# Patient Record
Sex: Female | Born: 1960 | Race: White | Hispanic: No | Marital: Married | Smoking: Never smoker
Health system: Southern US, Community
[De-identification: ages and names within clinical notes are randomized; demographics above are authoritative.]

## PROBLEM LIST (undated history)

## (undated) DIAGNOSIS — E119 Type 2 diabetes mellitus without complications: Secondary | ICD-10-CM

## (undated) DIAGNOSIS — E785 Hyperlipidemia, unspecified: Secondary | ICD-10-CM

## (undated) DIAGNOSIS — I10 Essential (primary) hypertension: Secondary | ICD-10-CM

## (undated) DIAGNOSIS — F411 Generalized anxiety disorder: Secondary | ICD-10-CM

## (undated) DIAGNOSIS — G459 Transient cerebral ischemic attack, unspecified: Secondary | ICD-10-CM

## (undated) HISTORY — DX: Generalized anxiety disorder: F41.1

## (undated) HISTORY — DX: Transient cerebral ischemic attack, unspecified: G45.9

## (undated) HISTORY — DX: Type 2 diabetes mellitus without complications: E11.9

## (undated) HISTORY — DX: Hyperlipidemia, unspecified: E78.5

## (undated) HISTORY — PX: DILATION AND CURETTAGE OF UTERUS: SHX78

## (undated) HISTORY — DX: Essential (primary) hypertension: I10

---

## 2011-04-23 ENCOUNTER — Other Ambulatory Visit (HOSPITAL_COMMUNITY): Payer: Self-pay | Admitting: Family Medicine

## 2011-04-23 DIAGNOSIS — Z139 Encounter for screening, unspecified: Secondary | ICD-10-CM

## 2011-04-24 ENCOUNTER — Telehealth: Payer: Self-pay

## 2011-04-24 DIAGNOSIS — Z800 Family history of malignant neoplasm of digestive organs: Secondary | ICD-10-CM

## 2011-04-24 DIAGNOSIS — Z139 Encounter for screening, unspecified: Secondary | ICD-10-CM

## 2011-04-24 NOTE — Telephone Encounter (Signed)
Gastroenterology Pre-Procedure Form  Request Date: 04/23/2011    Requesting Physician: Rush Barer, PA-C [facility] )     PATIENT INFORMATION:  Erin Olson is a 50 y.o., female (DOB=05-May-1961).  PROCEDURE: Procedure(s) requested: colonoscopy Procedure Reason: family history of colon cancer  PATIENT REVIEW QUESTIONS: The patient reports the following:   1. Diabetes Melitis: Yes 2. Joint replacements in the past 12 months: no 3. Major health problems in the past 3 months: no 4. Has an artificial valve or MVP:no 5. Has been advised in past to take antibiotics in advance of a procedure like teeth cleaning: no}    MEDICATIONS & ALLERGIES:    Patient reports the following regarding taking any blood thinners:   Plavix? no Aspirin? Yes Coumadin?  no  Patient confirms/reports the following medications:  Current Outpatient Prescriptions  Medication Sig Dispense Refill  . fish oil-omega-3 fatty acids 1000 MG capsule 1,200 mg 2 (two) times daily.        . insulin glargine (LANTUS) 100 UNIT/ML injection Inject 26 Units into the skin at bedtime.        Marland Kitchen lisinopril (PRINIVIL,ZESTRIL) 2.5 MG tablet Take 2.5 mg by mouth daily.        . Multiple Vitamin (MULTIVITAMIN) tablet Take 1 tablet by mouth daily.        . sertraline (ZOLOFT) 100 MG tablet Take 100 mg by mouth daily.          Patient confirms/reports the following allergies:  No Known Allergies  Patient is appropriate to schedule for requested procedure(s): yes  AUTHORIZATION INFORMATION Primary Insurance:   ID #: Group #:  Pre-Cert / Auth required:  Pre-Cert / Auth #:   PT IS CONTACTING BETTY RATLIFF ABOUT [place] DISCOUNT  Orders Placed This Encounter  Procedures  . Endoscopy, colon, diagnostic    Standing Status: Future     Number of Occurrences:      Standing Expiration Date: 04/23/2012    Order Specific Question:  Pre-op diagnosis    Answer:  No    Order Specific Question:  Pre-op visit  required?    Answer:  No [0]    SCHEDULE INFORMATION: Procedure has been scheduled as follows:  Date: 05/14/2011   Time:7:30 am Location: [facility] Short Stay  This Gastroenterology Pre-Precedure Form is being routed to the following provider(s) for review: Lorenza Burton, NP

## 2011-04-25 ENCOUNTER — Ambulatory Visit (HOSPITAL_COMMUNITY): Payer: Self-pay

## 2011-04-25 NOTE — Telephone Encounter (Signed)
Take 1/2 insulin night before procedure Check blood sugars frequently Note specific family hx on chart please

## 2011-04-30 NOTE — Telephone Encounter (Signed)
Pts Mom had colon cancer at age 52. Is doing well now. Pt was informed to check blood sugars regularly while doing prep and to take 1/2  Insulin night prior to procedure.

## 2011-05-01 ENCOUNTER — Ambulatory Visit (HOSPITAL_COMMUNITY)
Admission: RE | Admit: 2011-05-01 | Discharge: 2011-05-01 | Disposition: A | Discharge status: Home / Self Care | Payer: Self-pay | Source: Ambulatory Visit | Attending: Family Medicine | Admitting: Family Medicine

## 2011-05-01 DIAGNOSIS — Z139 Encounter for screening, unspecified: Secondary | ICD-10-CM

## 2011-05-01 DIAGNOSIS — Z1231 Encounter for screening mammogram for malignant neoplasm of breast: Secondary | ICD-10-CM | POA: Insufficient documentation

## 2011-05-14 ENCOUNTER — Encounter: Payer: Self-pay | Admitting: Internal Medicine

## 2011-05-14 ENCOUNTER — Ambulatory Visit (HOSPITAL_COMMUNITY): Admission: RE | Admit: 2011-05-14 | Payer: Self-pay | Source: Ambulatory Visit | Admitting: Internal Medicine

## 2011-07-09 ENCOUNTER — Encounter: Payer: Self-pay | Admitting: Internal Medicine

## 2011-07-09 ENCOUNTER — Encounter (HOSPITAL_COMMUNITY): Admission: RE | Payer: Self-pay | Source: Ambulatory Visit

## 2011-07-09 ENCOUNTER — Ambulatory Visit (HOSPITAL_COMMUNITY): Admission: RE | Admit: 2011-07-09 | Payer: Self-pay | Source: Ambulatory Visit | Admitting: Internal Medicine

## 2011-07-09 SURGERY — COLONOSCOPY
Anesthesia: Moderate Sedation

## 2017-06-17 ENCOUNTER — Inpatient Hospital Stay
Admission: RE | Admit: 2017-06-17 | Discharge: 2017-06-17 | Disposition: A | Discharge status: Home / Self Care | Payer: Self-pay | Source: Ambulatory Visit | Attending: *Deleted | Admitting: *Deleted

## 2017-06-17 ENCOUNTER — Ambulatory Visit
Admission: RE | Admit: 2017-06-17 | Discharge: 2017-06-17 | Disposition: A | Discharge status: Home / Self Care | Payer: Self-pay | Source: Ambulatory Visit | Attending: Oncology | Admitting: Oncology

## 2017-06-17 ENCOUNTER — Ambulatory Visit: Payer: Self-pay | Attending: Oncology

## 2017-06-17 ENCOUNTER — Other Ambulatory Visit: Payer: Self-pay | Admitting: *Deleted

## 2017-06-17 VITALS — BP 150/84 | HR 74 | Temp 98.5°F | Resp 16 | Ht 64.0 in | Wt 214.0 lb

## 2017-06-17 DIAGNOSIS — N63 Unspecified lump in unspecified breast: Secondary | ICD-10-CM

## 2017-06-17 DIAGNOSIS — Z9289 Personal history of other medical treatment: Secondary | ICD-10-CM

## 2017-06-17 NOTE — Progress Notes (Signed)
Subjective:     Patient ID: Erin SpittleJohonnah Olson, female   DOB: [birth year], 56 y.o.   MRN: [id number]  HPI   Review of Systems     Objective:   Physical Exam  Pulmonary/Chest: Right breast exhibits no inverted nipple, no mass, no nipple discharge, no skin change and no tenderness. Left breast exhibits no inverted nipple, no mass, no nipple discharge, no skin change and no tenderness. Breasts are symmetrical.    Bilateral thickening upper outer quadrants       Assessment:     56 [facility]  [facility] follow-up mamm[facility] for left breast mass per patient.  Original mammogram performed on [facility] unit.  Patient screened, and meets BCCCP eligibility.  Patient does not have insurance, Medicare or Medicaid.  Handout given on Affordable Care Act.  Instructed patient on breast self-exam using teach back method.  Palpated bilateral thickening upper outer quadrant.      Plan:     Sent for bilateral diagnostic mammogram, and bilateral breast ultrasound .  Patient is to return for pap due to need [facility].

## 2017-07-15 ENCOUNTER — Ambulatory Visit: Payer: Self-pay | Attending: Oncology

## 2017-07-15 ENCOUNTER — Ambulatory Visit: Admission: RE | Admit: 2017-07-15 | Payer: Self-pay | Source: Ambulatory Visit

## 2017-07-15 ENCOUNTER — Ambulatory Visit
Admission: RE | Admit: 2017-07-15 | Discharge: 2017-07-15 | Disposition: A | Discharge status: Home / Self Care | Payer: Self-pay | Source: Ambulatory Visit | Attending: Oncology | Admitting: Oncology

## 2017-07-15 VITALS — BP 136/78 | HR 90 | Temp 98.1°F | Resp 18 | Ht 63.0 in | Wt 215.2 lb

## 2017-07-15 DIAGNOSIS — Z0000 Encounter for general adult medical examination without abnormal findings: Secondary | ICD-10-CM

## 2017-07-15 DIAGNOSIS — N63 Unspecified lump in unspecified breast: Secondary | ICD-10-CM

## 2017-07-15 NOTE — Progress Notes (Signed)
Subjective:     Patient ID: Erin Olson, female   DOB: 14-Sep-1961, 56 y.o.   MRN: [id number]  HPI   Review of Systems     Objective:   Physical Exam  Genitourinary: No labial fusion. There is no rash, tenderness, lesion or injury on the right labia. There is no rash, tenderness, lesion or injury on the left labia. Uterus is not deviated, not enlarged, not fixed and not tender. Cervix exhibits no motion tenderness, no discharge and no friability. Right adnexum displays no mass, no tenderness and no fullness. Left adnexum displays no mass, no tenderness and no fullness. No erythema, tenderness or bleeding in the vagina. No foreign body in the vagina. No signs of injury around the vagina. No vaginal discharge found.       Assessment:     56 year old patient returns for pap. Pelvic exam normal.    Plan:     Specimen collected for pap.   Patient had additional views today.  Results Birads 3 given to patient.  Erin Olson to schedule patient for 6 month follow-up mammogram and ultrasound.

## 2017-07-18 LAB — PAP LB AND HPV HIGH-RISK
HPV, high-risk: NEGATIVE
PAP Smear Comment: 0

## 2017-10-21 NOTE — Progress Notes (Signed)
Letter mailed to notify patient of normal pap results, and to remind patient of 6 mont follow-up mammogram.  Copy to [facility]IS.

## 2018-01-19 ENCOUNTER — Ambulatory Visit
Admission: RE | Admit: 2018-01-19 | Discharge: 2018-01-19 | Disposition: A | Discharge status: Home / Self Care | Payer: Self-pay | Source: Ambulatory Visit | Attending: Oncology | Admitting: Oncology

## 2018-01-19 DIAGNOSIS — N63 Unspecified lump in unspecified breast: Secondary | ICD-10-CM

## 2018-01-19 DIAGNOSIS — R928 Other abnormal and inconclusive findings on diagnostic imaging of breast: Secondary | ICD-10-CM | POA: Insufficient documentation

## 2018-01-19 DIAGNOSIS — N6321 Unspecified lump in the left breast, upper outer quadrant: Secondary | ICD-10-CM | POA: Insufficient documentation

## 2018-01-20 ENCOUNTER — Encounter: Payer: Self-pay | Admitting: *Deleted

## 2018-01-20 NOTE — Progress Notes (Signed)
Patient with birads 2 results from 6 month follow up mammogram.  Next bilateral mammogram due in July.  Letter mailed to inform patient of her next [facility]CP and mammogram appointment on July 21, 2018 @ 1:00.  [facility] [place].

## 2018-07-21 ENCOUNTER — Ambulatory Visit: Payer: Self-pay | Attending: Oncology

## 2020-04-20 ENCOUNTER — Other Ambulatory Visit: Payer: Self-pay | Admitting: Emergency Medicine

## 2020-04-20 DIAGNOSIS — Z1231 Encounter for screening mammogram for malignant neoplasm of breast: Secondary | ICD-10-CM

## 2020-04-25 ENCOUNTER — Encounter: Payer: Self-pay | Admitting: Nurse Practitioner

## 2020-05-10 ENCOUNTER — Telehealth: Payer: Self-pay

## 2020-05-10 NOTE — Telephone Encounter (Signed)
Pt called and works for [employer]. Her insurance is [employer] and it had [facility] listed on the card and doesn't have EG listed. Pt wants to make sure all providers accept her insurance prior to coming to her apt. [phone] ext [phone]

## 2020-05-11 NOTE — Telephone Encounter (Signed)
She will have to call, I will let her know..I would think it would be fine

## 2020-05-11 NOTE — Telephone Encounter (Signed)
Noted  

## 2020-05-29 ENCOUNTER — Encounter: Payer: Self-pay | Admitting: Nurse Practitioner

## 2020-05-29 ENCOUNTER — Ambulatory Visit: Payer: Self-pay | Admitting: Nurse Practitioner

## 2020-05-29 NOTE — Progress Notes (Deleted)
Primary Care Physician:  Ivan Anchors, FNP Primary Gastroenterologist:  Dr. Gala Romney  No chief complaint on file.   HPI:   Erin Olson is a 59 y.o. female who presents on referral from primary care to schedule colonoscopy.  Nurse/phone triage was deferred office visit due to medications likely necessitating MAC sedation.  It appears she was previously triaged in 2012, though I cannot find a record of colonoscopy being completed at that time.  Reviewed information provided with referral including ***.  Today she states   No past medical history on file.  No past surgical history on file.  Current Outpatient Medications  Medication Sig Dispense Refill  . fish oil-omega-3 fatty acids 1000 MG capsule 1,200 mg 2 (two) times daily.      . insulin glargine (LANTUS) 100 UNIT/ML injection Inject 26 Units into the skin at bedtime.      Marland Kitchen lisinopril (PRINIVIL,ZESTRIL) 2.5 MG tablet Take 2.5 mg by mouth daily.      . Multiple Vitamin (MULTIVITAMIN) tablet Take 1 tablet by mouth daily.      . sertraline (ZOLOFT) 100 MG tablet Take 100 mg by mouth daily.       No current facility-administered medications for this visit.    Allergies as of 05/29/2020 - Review Complete 07/15/2017  Allergen Reaction Noted  . Latex  06/17/2017    Family History  Problem Relation Age of Onset  . Breast cancer Neg Hx     Social History   Socioeconomic History  . Marital status: Married    Spouse name: Not on file  . Number of children: Not on file  . Years of education: Not on file  . Highest education level: Not on file  Occupational History  . Not on file  Tobacco Use  . Smoking status: Never Smoker  . Smokeless tobacco: Never Used  Substance and Sexual Activity  . Alcohol use: No  . Drug use: No  . Sexual activity: Yes  Other Topics Concern  . Not on file  Social History Narrative  . Not on file   Social Determinants of Health   Financial Resource Strain:   . Difficulty of Paying  Living Expenses:   Food Insecurity:   . Worried About Charity fundraiser in the Last Year:   . Arboriculturist in the Last Year:   Transportation Needs:   . Film/video editor (Medical):   Marland Kitchen Lack of Transportation (Non-Medical):   Physical Activity:   . Days of Exercise per Week:   . Minutes of Exercise per Session:   Stress:   . Feeling of Stress :   Social Connections:   . Frequency of Communication with Friends and Family:   . Frequency of Social Gatherings with Friends and Family:   . Attends Religious Services:   . Active Member of Clubs or Organizations:   . Attends Archivist Meetings:   Marland Kitchen Marital Status:   Intimate Partner Violence:   . Fear of Current or Ex-Partner:   . Emotionally Abused:   Marland Kitchen Physically Abused:   . Sexually Abused:     Subjective: Review of Systems  Constitutional: Negative for chills, fever, malaise/fatigue and weight loss.  HENT: Negative for congestion and sore throat.   Respiratory: Negative for cough and shortness of breath.   Cardiovascular: Negative for chest pain and palpitations.  Gastrointestinal: Negative for abdominal pain, blood in stool, diarrhea, melena, nausea and vomiting.  Musculoskeletal: Negative for joint pain and  myalgias.  Skin: Negative for rash.  Neurological: Negative for dizziness and weakness.  Endo/Heme/Allergies: Does not bruise/bleed easily.  Psychiatric/Behavioral: Negative for depression. The patient is not nervous/anxious.   All other systems reviewed and are negative.      Objective: There were no vitals taken for this visit. Physical Exam Vitals and nursing note reviewed.  Constitutional:      General: She is not in acute distress.    Appearance: Normal appearance. She is well-developed. She is not ill-appearing, toxic-appearing or diaphoretic.  HENT:     Head: Normocephalic and atraumatic.     Nose: No congestion or rhinorrhea.  Eyes:     General: No scleral icterus. Cardiovascular:       Rate and Rhythm: Normal rate and regular rhythm.     Heart sounds: Normal heart sounds.  Pulmonary:     Effort: Pulmonary effort is normal. No respiratory distress.     Breath sounds: Normal breath sounds.  Abdominal:     General: Bowel sounds are normal.     Palpations: Abdomen is soft. There is no hepatomegaly, splenomegaly or mass.     Tenderness: There is no abdominal tenderness. There is no guarding or rebound.     Hernia: No hernia is present.  Skin:    General: Skin is warm and dry.     Coloration: Skin is not jaundiced.     Findings: No rash.  Neurological:     General: No focal deficit present.     Mental Status: She is alert and oriented to person, place, and time.  Psychiatric:        Attention and Perception: Attention normal.        Mood and Affect: Mood normal.        Speech: Speech normal.        Behavior: Behavior normal.        Thought Content: Thought content normal.        Cognition and Memory: Cognition and memory normal.       05/29/2020 9:57 AM   Disclaimer: This note was dictated with voice recognition software. Similar sounding words can inadvertently be transcribed and may not be corrected upon review.

## 2020-08-13 ENCOUNTER — Other Ambulatory Visit (HOSPITAL_COMMUNITY): Payer: Self-pay | Admitting: Emergency Medicine

## 2020-08-13 ENCOUNTER — Other Ambulatory Visit: Payer: Self-pay | Admitting: Emergency Medicine

## 2020-08-13 DIAGNOSIS — G459 Transient cerebral ischemic attack, unspecified: Secondary | ICD-10-CM

## 2020-12-03 ENCOUNTER — Other Ambulatory Visit (HOSPITAL_COMMUNITY): Payer: Self-pay | Admitting: Emergency Medicine

## 2020-12-03 DIAGNOSIS — G459 Transient cerebral ischemic attack, unspecified: Secondary | ICD-10-CM

## 2021-04-04 ENCOUNTER — Encounter: Payer: Self-pay | Admitting: Orthopaedic Surgery

## 2021-04-04 ENCOUNTER — Other Ambulatory Visit: Payer: Self-pay

## 2021-04-04 ENCOUNTER — Ambulatory Visit: Payer: BC Managed Care – PPO | Admitting: Orthopaedic Surgery

## 2021-04-04 ENCOUNTER — Ambulatory Visit: Payer: BC Managed Care – PPO

## 2021-04-04 VITALS — BP 124/71 | HR 87 | Ht 61.0 in | Wt 234.5 lb

## 2021-04-04 DIAGNOSIS — Z6841 Body Mass Index (BMI) 40.0 and over, adult: Secondary | ICD-10-CM

## 2021-04-04 DIAGNOSIS — M7062 Trochanteric bursitis, left hip: Secondary | ICD-10-CM | POA: Diagnosis not present

## 2021-04-04 DIAGNOSIS — M5432 Sciatica, left side: Secondary | ICD-10-CM

## 2021-04-04 MED ORDER — NAPROXEN 500 MG PO TABS: 500.0000 mg | ORAL_TABLET | Freq: Two times a day (BID) | ORAL | 5 refills | Status: DC

## 2021-04-04 MED ORDER — CYCLOBENZAPRINE HCL 10 MG PO TABS: 10.0000 mg | ORAL_TABLET | Freq: Every day | ORAL | 0 refills | Status: DC

## 2021-04-04 MED ORDER — HYDROCODONE-ACETAMINOPHEN 5-325 MG PO TABS: ORAL_TABLET | ORAL | 0 refills | Status: DC

## 2021-04-04 NOTE — Patient Instructions (Signed)
Out of work today and tomorrow. 

## 2021-04-04 NOTE — Progress Notes (Signed)
Subjective:    Patient ID: Erin Olson, female    DOB: [birth year], 60 y.o.   MRN: [id number]  HPI She has been having left hip pain and lower back pain for about six weeks or so.  It has been getting worse.  She is not sleeping well. She is having pain sitting for a while.  The pain is on the outside of the left hip and radiates to the knee but not below.  She has been seen at [facility].  I have copies of their notes. She was first seen there for this on 03-07-21.  She had X-rays of the hip and pelvis.  I have independently reviewed and interpreted x-rays of this patient done at another site by another physician or qualified health professional.  I have reviewed the notes.  She is not improving. She has taken two prednisone dose packs, has been on ibuprofen 800 tid, and on Toradol. She says she is hurting more.  She has no trauma.    She works as a Scientist, physiological.   Review of Systems  Constitutional: Positive for activity change.  Musculoskeletal: Positive for arthralgias, gait problem and myalgias.  All other systems reviewed and are negative.  For Review of Systems, all other systems reviewed and are negative.  The following is a summary of the past history medically, past history surgically, known current medicines, social history and family history.  This information is gathered electronically by the computer from prior information and documentation.  I review this each visit and have found including this information at this point in the chart is beneficial and informative.   History reviewed. No pertinent past medical history.  History reviewed. No pertinent surgical history.  Current Outpatient Medications on File Prior to Visit  Medication Sig Dispense Refill  . ALPRAZolam (XANAX) 0.5 MG tablet Take 0.5 mg by mouth at bedtime as needed for anxiety.    . diclofenac Sodium (VOLTAREN) 1 % GEL Apply topically 4 (four) times daily.    Marland Kitchen gabapentin (NEURONTIN) 300 MG capsule Take  300 mg by mouth 3 (three) times daily.    . Multiple Vitamin (MULTIVITAMIN) tablet Take 1 tablet by mouth daily.    . sertraline (ZOLOFT) 100 MG tablet Take 100 mg by mouth daily.    . traMADol (ULTRAM) 50 MG tablet Take by mouth every 6 (six) hours as needed.    . fish oil-omega-3 fatty acids 1000 MG capsule 1,200 mg 2 (two) times daily.   (Patient not taking: Reported on 04/04/2021)    . insulin glargine (LANTUS) 100 UNIT/ML injection Inject 26 Units into the skin at bedtime.   (Patient not taking: Reported on 04/04/2021)    . lisinopril (PRINIVIL,ZESTRIL) 2.5 MG tablet Take 2.5 mg by mouth daily.   (Patient not taking: Reported on 04/04/2021)     No current facility-administered medications on file prior to visit.    Social History   Socioeconomic History  . Marital status: Married    Spouse name: Not on file  . Number of children: Not on file  . Years of education: Not on file  . Highest education level: Not on file  Occupational History  . Not on file  Tobacco Use  . Smoking status: Never Smoker  . Smokeless tobacco: Never Used  Vaping Use  . Vaping Use: Never used  Substance and Sexual Activity  . Alcohol use: No  . Drug use: No  . Sexual activity: Yes  Other Topics Concern  . Not on  file  Social History Narrative  . Not on file   Social Determinants of Health   Financial Resource Strain: Not on file  Food Insecurity: Not on file  Transportation Needs: Not on file  Physical Activity: Not on file  Stress: Not on file  Social Connections: Not on file  Intimate Partner Violence: Not on file    Family History  Problem Relation Age of Onset  . Breast cancer Neg Hx     BP 124/71   Pulse 87   Ht 5\' 1"  (1.549 m)   Wt 234 lb 8 oz (106.4 kg)   BMI 44.31 kg/m   Body mass index is 44.31 kg/m.      Objective:   Physical Exam Vitals and nursing note reviewed. Exam conducted with a chaperone present.  Constitutional:      Appearance: She is well-developed.  HENT:      Head: Normocephalic and atraumatic.  Eyes:     Conjunctiva/sclera: Conjunctivae normal.     Pupils: Pupils are equal, round, and reactive to light.  Cardiovascular:     Rate and Rhythm: Normal rate and regular rhythm.  Pulmonary:     Effort: Pulmonary effort is normal.  Abdominal:     Palpations: Abdomen is soft.  Musculoskeletal:     Cervical back: Normal range of motion and neck supple.       Legs:  Skin:    General: Skin is warm and dry.  Neurological:     Mental Status: She is alert and oriented to person, place, and time.     Cranial Nerves: No cranial nerve deficit.     Motor: No abnormal muscle tone.     Coordination: Coordination normal.     Deep Tendon Reflexes: Reflexes are normal and symmetric. Reflexes normal.  Psychiatric:        Behavior: Behavior normal.        Thought Content: Thought content normal.        Judgment: Judgment normal.    X-rays were done of the lumbar spine, reported separately.      Assessment & Plan:   Encounter Diagnoses  Name Primary?  . Back pain with left-sided sciatica Yes  . Trochanteric bursitis, left hip   . Body mass index 40.0-44.9, adult (HCC)   . Morbid obesity (HCC)    PROCEDURE NOTE:  The patient request injection, verbal consent was obtained.  The left trochanteric area of the hip was prepped appropriately after time out was performed.   Sterile technique was observed and injection of 1 cc of Celestone 6 mg with several cc's of plain xylocaine. Anesthesia was provided by ethyl chloride and a 20-gauge needle was used to inject the hip area. The injection was tolerated well.  A band aid dressing was applied.  The patient was advised to apply ice later today and tomorrow to the injection sight as needed.  I will change to naprosyn, flexeril and norco.  I have reviewed the 10-11-1982 Controlled Substance Reporting System web site prior to prescribing narcotic medicine for this patient.   She may need MRI  of the lumbar spine.  Return in two weeks.  Stay out of work today and tomorrow.  Call if any problem.  Precautions discussed.     Electronically Signed [place], MD 4/7/20229:59 AM

## 2021-04-18 ENCOUNTER — Ambulatory Visit: Payer: BC Managed Care – PPO | Admitting: Orthopaedic Surgery

## 2021-04-18 ENCOUNTER — Other Ambulatory Visit: Payer: Self-pay

## 2021-04-18 ENCOUNTER — Encounter: Payer: Self-pay | Admitting: Orthopaedic Surgery

## 2021-04-18 VITALS — Ht 61.0 in | Wt 230.0 lb

## 2021-04-18 DIAGNOSIS — M7062 Trochanteric bursitis, left hip: Secondary | ICD-10-CM | POA: Diagnosis not present

## 2021-04-18 DIAGNOSIS — Z6841 Body Mass Index (BMI) 40.0 and over, adult: Secondary | ICD-10-CM

## 2021-04-18 DIAGNOSIS — M5432 Sciatica, left side: Secondary | ICD-10-CM | POA: Diagnosis not present

## 2021-04-18 MED ORDER — CYCLOBENZAPRINE HCL 10 MG PO TABS: 10.0000 mg | ORAL_TABLET | Freq: Every day | ORAL | 0 refills | Status: DC

## 2021-04-18 MED ORDER — HYDROCODONE-ACETAMINOPHEN 5-325 MG PO TABS: ORAL_TABLET | ORAL | 0 refills | Status: DC

## 2021-04-18 NOTE — Progress Notes (Signed)
Patient DQ:[id number] Erin Olson, female DOB:1961-05-14, 60 y.o. [id number]  Chief Complaint  Patient presents with  . Back Pain    HPI  Erin Olson is a 60 y.o. female who has back pain and left sided trochanteric bursitis.  She is better. She still has pain but much less.  She is sleeping better.  She is taking her medicine. I have encouraged walking.  I will not get MRI as she is improving.   Body mass index is 43.46 kg/m.  The patient meets the AMA guidelines for Morbid (severe) obesity with a BMI > 40.0 and I have recommended weight loss.   ROS  Review of Systems  Constitutional: Positive for activity change.  Musculoskeletal: Positive for arthralgias, gait problem and myalgias.  All other systems reviewed and are negative.   All other systems reviewed and are negative.  The following is a summary of the past history medically, past history surgically, known current medicines, social history and family history.  This information is gathered electronically by the computer from prior information and documentation.  I review this each visit and have found including this information at this point in the chart is beneficial and informative.    History reviewed. No pertinent past medical history.  History reviewed. No pertinent surgical history.  Family History  Problem Relation Age of Onset  . Breast cancer Neg Hx     Social History Social History   Tobacco Use  . Smoking status: Never Smoker  . Smokeless tobacco: Never Used  Vaping Use  . Vaping Use: Never used  Substance Use Topics  . Alcohol use: No  . Drug use: No    Allergies  Allergen Reactions  . Latex     Current Outpatient Medications  Medication Sig Dispense Refill  . ALPRAZolam (XANAX) 0.5 MG tablet Take 0.5 mg by mouth at bedtime as needed for anxiety.    Marland Kitchen atorvastatin (LIPITOR) 40 MG tablet Take 1 tablet by mouth daily.    . cyclobenzaprine (FLEXERIL) 10 MG tablet Take 1 tablet (10 mg total) by  mouth at bedtime. One tablet every night at bedtime as needed for spasm. 30 tablet 0  . diclofenac Sodium (VOLTAREN) 1 % GEL Apply topically 4 (four) times daily.    . fish oil-omega-3 fatty acids 1000 MG capsule 1,200 mg 2 (two) times daily.    Marland Kitchen gabapentin (NEURONTIN) 300 MG capsule Take 300 mg by mouth 3 (three) times daily.    Marland Kitchen HYDROcodone-acetaminophen (NORCO/VICODIN) 5-325 MG tablet One tablet every four hours for pain. 30 tablet 0  . LEVEMIR FLEXTOUCH 100 UNIT/ML FlexPen Inject into the skin.    Marland Kitchen lisinopril (PRINIVIL,ZESTRIL) 2.5 MG tablet Take 2.5 mg by mouth daily.    . Multiple Vitamin (MULTIVITAMIN) tablet Take 1 tablet by mouth daily.    . naproxen (NAPROSYN) 500 MG tablet Take 1 tablet (500 mg total) by mouth 2 (two) times daily with a meal. 60 tablet 5  . NOVOLOG FLEXPEN 100 UNIT/ML FlexPen Inject into the skin.    Marland Kitchen sertraline (ZOLOFT) 100 MG tablet Take 100 mg by mouth daily.    . traMADol (ULTRAM) 50 MG tablet Take by mouth every 6 (six) hours as needed.     No current facility-administered medications for this visit.     Physical Exam  Height 5\' 1"  (1.549 m), weight 230 lb (104.3 kg).  Constitutional: overall normal hygiene, normal nutrition, well developed, normal grooming, normal body habitus. Assistive device:none  Musculoskeletal: gait and station Limp none,  muscle tone and strength are normal, no tremors or atrophy is present.  .  Neurological: coordination overall normal.  Deep tendon reflex/nerve stretch intact.  Sensation normal.  Cranial nerves II-XII intact.   Skin:   Normal overall no scars, lesions, ulcers or rashes. No psoriasis.  Psychiatric: Alert and oriented x 3.  Recent memory intact, remote memory unclear.  Normal mood and affect. Well groomed.  Good eye contact.  Cardiovascular: overall no swelling, no varicosities, no edema bilaterally, normal temperatures of the legs and arms, no clubbing, cyanosis and good capillary refill.  Lymphatic:  palpation is normal.  Spine/Pelvis examination:  Inspection:  Overall, sacoiliac joint benign and hips nontender; without crepitus or defects.   Thoracic spine inspection: Alignment normal without kyphosis present   Lumbar spine inspection:  Alignment  with normal lumbar lordosis, without scoliosis apparent.   Thoracic spine palpation:  without tenderness of spinal processes   Lumbar spine palpation: without tenderness of lumbar area; without tightness of lumbar muscles    Range of Motion:   Lumbar flexion, forward flexion is normal without pain or tenderness    Lumbar extension is full without pain or tenderness   Left lateral bend is normal without pain or tenderness   Right lateral bend is normal without pain or tenderness   Straight leg raising is normal  Strength & tone: normal   Stability overall normal stability Hip has full ROM left and much less pain laterally. All other systems reviewed and are negative   The patient has been educated about the nature of the problem(s) and counseled on treatment options.  The patient appeared to understand what I have discussed and is in agreement with it.  Encounter Diagnoses  Name Primary?  . Back pain with left-sided sciatica Yes  . Trochanteric bursitis, left hip   . Body mass index 40.0-44.9, adult (HCC)   . Morbid obesity (HCC)     PLAN Call if any problems.  Precautions discussed.  Continue current medications.   Return to clinic 3 weeks   I refilled her pain medicine and Flexeril.  Electronically Signed Darreld Mclean, MD 4/21/20228:56 AM

## 2021-05-09 ENCOUNTER — Encounter: Payer: Self-pay | Admitting: Orthopaedic Surgery

## 2021-05-09 ENCOUNTER — Other Ambulatory Visit: Payer: Self-pay

## 2021-05-09 ENCOUNTER — Ambulatory Visit: Payer: BC Managed Care – PPO | Admitting: Orthopaedic Surgery

## 2021-05-09 VITALS — BP 158/80 | HR 107 | Ht 61.0 in | Wt 229.0 lb

## 2021-05-09 DIAGNOSIS — M5432 Sciatica, left side: Secondary | ICD-10-CM

## 2021-05-09 NOTE — Progress Notes (Signed)
Patient Erin Olson, female DOB:[birth year], 60 y.o. [id number]  Chief Complaint  Patient presents with  . Hip Pain    Lt side  . Back Pain    HPI  Erin Olson is a 60 y.o. female who has redeveloped lower back pain with left sided sciatica.  It has gotten worse over the last ten days.  She is taking ibuprofen and using ice but it helps only slightly.  I told her I would get MRI if not improved. She has no weakness and has pain running to the left foot.   Body mass index is 43.27 kg/m.  The patient meets the AMA guidelines for Morbid (severe) obesity with a BMI > 40.0 and I have recommended weight loss.   ROS  Review of Systems  Constitutional: Positive for activity change.  Musculoskeletal: Positive for arthralgias, gait problem and myalgias.  All other systems reviewed and are negative.   All other systems reviewed and are negative.  The following is a summary of the past history medically, past history surgically, known current medicines, social history and family history.  This information is gathered electronically by the computer from prior information and documentation.  I review this each visit and have found including this information at this point in the chart is beneficial and informative.    History reviewed. No pertinent past medical history.  History reviewed. No pertinent surgical history.  Family History  Problem Relation Age of Onset  . Breast cancer Neg Hx     Social History Social History   Tobacco Use  . Smoking status: Never Smoker  . Smokeless tobacco: Never Used  Vaping Use  . Vaping Use: Never used  Substance Use Topics  . Alcohol use: No  . Drug use: No    Allergies  Allergen Reactions  . Latex     Current Outpatient Medications  Medication Sig Dispense Refill  . ALPRAZolam (XANAX) 0.5 MG tablet Take 0.5 mg by mouth at bedtime as needed for anxiety.    Marland Kitchen atorvastatin (LIPITOR) 40 MG tablet Take 1 tablet by mouth daily.     . cyclobenzaprine (FLEXERIL) 10 MG tablet Take 1 tablet (10 mg total) by mouth at bedtime. One tablet every night at bedtime as needed for spasm. 30 tablet 0  . diclofenac Sodium (VOLTAREN) 1 % GEL Apply topically 4 (four) times daily.    . fish oil-omega-3 fatty acids 1000 MG capsule 1,200 mg 2 (two) times daily.    Marland Kitchen gabapentin (NEURONTIN) 300 MG capsule Take 300 mg by mouth 3 (three) times daily.    Marland Kitchen HYDROcodone-acetaminophen (NORCO/VICODIN) 5-325 MG tablet One tablet every six hours for pain.  Limit 7 days. (Patient not taking: Reported on 05/09/2021) 28 tablet 0  . LEVEMIR FLEXTOUCH 100 UNIT/ML FlexPen Inject into the skin.    Marland Kitchen lisinopril (PRINIVIL,ZESTRIL) 2.5 MG tablet Take 2.5 mg by mouth daily. (Patient not taking: Reported on 05/09/2021)    . Multiple Vitamin (MULTIVITAMIN) tablet Take 1 tablet by mouth daily.    . naproxen (NAPROSYN) 500 MG tablet Take 1 tablet (500 mg total) by mouth 2 (two) times daily with a meal. 60 tablet 5  . NOVOLOG FLEXPEN 100 UNIT/ML FlexPen Inject into the skin.    Marland Kitchen sertraline (ZOLOFT) 100 MG tablet Take 100 mg by mouth daily.    . traMADol (ULTRAM) 50 MG tablet Take by mouth every 6 (six) hours as needed. (Patient not taking: Reported on 05/09/2021)     No current facility-administered medications for  this visit.     Physical Exam  Blood pressure (!) 158/80, pulse (!) 107, height 5\' 1"  (1.549 m), weight 229 lb (103.9 kg).  Constitutional: overall normal hygiene, normal nutrition, well developed, normal grooming, normal body habitus. Assistive device:none  Musculoskeletal: gait and station Limp none, muscle tone and strength are normal, no tremors or atrophy is present.  .  Neurological: coordination overall normal.  Deep tendon reflex/nerve stretch intact.  Sensation normal.  Cranial nerves II-XII intact.   Skin:   Normal overall no scars, lesions, ulcers or rashes. No psoriasis.  Psychiatric: Alert and oriented x 3.  Recent memory intact, remote  memory unclear.  Normal mood and affect. Well groomed.  Good eye contact.  Cardiovascular: overall no swelling, no varicosities, no edema bilaterally, normal temperatures of the legs and arms, no clubbing, cyanosis and good capillary refill.  Spine/Pelvis examination:  Inspection:  Overall, sacoiliac joint benign and hips nontender; without crepitus or defects.   Thoracic spine inspection: Alignment normal without kyphosis present   Lumbar spine inspection:  Alignment  with normal lumbar lordosis, without scoliosis apparent.   Thoracic spine palpation:  without tenderness of spinal processes   Lumbar spine palpation: without tenderness of lumbar area; without tightness of lumbar muscles    Range of Motion:   Lumbar flexion, forward flexion is normal without pain or tenderness    Lumbar extension is full without pain or tenderness   Left lateral bend is normal without pain or tenderness   Right lateral bend is normal without pain or tenderness   Straight leg raising is normal  Strength & tone: normal   Stability overall normal stability  Lymphatic: palpation is normal.  All other systems reviewed and are negative   The patient has been educated about the nature of the problem(s) and counseled on treatment options.  The patient appeared to understand what I have discussed and is in agreement with it.  Encounter Diagnosis  Name Primary?  . Back pain with left-sided sciatica Yes    PLAN Call if any problems.  Precautions discussed.  Continue current medications.   Return to clinic 3 weeks   Get MRI lumbar spine.  Electronically Signed , MD 5/12/20229:05 AM

## 2021-05-13 ENCOUNTER — Telehealth: Payer: Self-pay | Admitting: Orthopaedic Surgery

## 2021-05-13 MED ORDER — CYCLOBENZAPRINE HCL 10 MG PO TABS: 10.0000 mg | ORAL_TABLET | Freq: Every day | ORAL | 0 refills | Status: DC

## 2021-05-13 NOTE — Telephone Encounter (Signed)
Patient called to tell us when her MRI was and that Dr. Hilda Lias was going to call in a prescription for her   cyclobenzaprine (FLEXERIL) 10 MG tablet   Pharmacy: [facility] said they never got the RX

## 2021-05-23 ENCOUNTER — Ambulatory Visit (HOSPITAL_COMMUNITY)
Admission: RE | Admit: 2021-05-23 | Discharge: 2021-05-23 | Disposition: A | Discharge status: Home / Self Care | Payer: BC Managed Care – PPO | Source: Ambulatory Visit | Attending: Orthopaedic Surgery | Admitting: Orthopaedic Surgery

## 2021-05-23 DIAGNOSIS — M5432 Sciatica, left side: Secondary | ICD-10-CM | POA: Diagnosis not present

## 2021-05-30 ENCOUNTER — Encounter: Payer: Self-pay | Admitting: Orthopaedic Surgery

## 2021-05-30 ENCOUNTER — Ambulatory Visit: Payer: BC Managed Care – PPO | Admitting: Orthopaedic Surgery

## 2021-05-30 ENCOUNTER — Other Ambulatory Visit: Payer: Self-pay

## 2021-05-30 VITALS — Ht 61.0 in | Wt 229.0 lb

## 2021-05-30 DIAGNOSIS — Z6841 Body Mass Index (BMI) 40.0 and over, adult: Secondary | ICD-10-CM | POA: Diagnosis not present

## 2021-05-30 DIAGNOSIS — M5432 Sciatica, left side: Secondary | ICD-10-CM | POA: Diagnosis not present

## 2021-05-30 NOTE — Progress Notes (Signed)
Patient XT:[name] Erin Olson, female DOB:1961-12-25, 60 y.o. IOE:[id number]  Chief Complaint  Patient presents with  . Back Pain    LBP MRI results     HPI  Erin Olson is a 60 y.o. female who has left sided sciatica and lower back pain.  She had MRI which showed:  IMPRESSION: Multilevel degenerative changes as detailed above. 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 There is narrowing of the subarticular recesses and neural foramina at L4-L5  I have explained the findings to her.  I will arrange for epidural with Dr. Alvester Morin.   I have independently reviewed the MRI.       Body mass index is 43.27 kg/m.  The patient meets the AMA guidelines for Morbid (severe) obesity with a BMI > 40.0 and I have recommended weight loss.   ROS  Review of Systems  Constitutional: Positive for activity change.  Musculoskeletal: Positive for arthralgias, gait problem and myalgias.  All other systems reviewed and are negative.   All other systems reviewed and are negative.  The following is a summary of the past history medically, past history surgically, known current medicines, social history and family history.  This information is gathered electronically by the computer from prior information and documentation.  I review this each visit and have found including this information at this point in the chart is beneficial and informative.    History reviewed. No pertinent past medical history.  History reviewed. No pertinent surgical history.  Family History  Problem Relation Age of Onset  . Breast cancer Neg Hx     Social History Social History   Tobacco Use  . Smoking status: Never Smoker  . Smokeless tobacco: Never Used  Vaping Use  . Vaping Use: Never used  Substance Use Topics  . Alcohol use: No  . Drug use: No    Allergies  Allergen Reactions  . Latex     Current Outpatient Medications  Medication Sig Dispense Refill  . ALPRAZolam (XANAX) 0.5 MG tablet Take  0.5 mg by mouth at bedtime as needed for anxiety.    Marland Kitchen atorvastatin (LIPITOR) 40 MG tablet Take 1 tablet by mouth daily.    . cyclobenzaprine (FLEXERIL) 10 MG tablet Take 1 tablet (10 mg total) by mouth at bedtime. One tablet every night at bedtime as needed for spasm. 30 tablet 0  . diclofenac Sodium (VOLTAREN) 1 % GEL Apply topically 4 (four) times daily.    . fish oil-omega-3 fatty acids 1000 MG capsule 1,200 mg 2 (two) times daily.    Marland Kitchen gabapentin (NEURONTIN) 300 MG capsule Take 300 mg by mouth 3 (three) times daily.    Marland Kitchen HYDROcodone-acetaminophen (NORCO/VICODIN) 5-325 MG tablet One tablet every six hours for pain.  Limit 7 days. (Patient not taking: Reported on 05/09/2021) 28 tablet 0  . LEVEMIR FLEXTOUCH 100 UNIT/ML FlexPen Inject into the skin.    Marland Kitchen lisinopril (PRINIVIL,ZESTRIL) 2.5 MG tablet Take 2.5 mg by mouth daily. (Patient not taking: Reported on 05/09/2021)    . Multiple Vitamin (MULTIVITAMIN) tablet Take 1 tablet by mouth daily.    . naproxen (NAPROSYN) 500 MG tablet Take 1 tablet (500 mg total) by mouth 2 (two) times daily with a meal. 60 tablet 5  . NOVOLOG FLEXPEN 100 UNIT/ML FlexPen Inject into the skin.    Marland Kitchen sertraline (ZOLOFT) 100 MG tablet Take 100 mg by mouth daily.    . traMADol (ULTRAM) 50 MG tablet Take by mouth every 6 (six) hours as needed. (Patient  not taking: Reported on 05/09/2021)     No current facility-administered medications for this visit.     Physical Exam  Height 5\' 1"  (1.549 m), weight 229 lb (103.9 kg).  Constitutional: overall normal hygiene, normal nutrition, well developed, normal grooming, normal body habitus. Assistive device:none  Musculoskeletal: gait and station Limp none, muscle tone and strength are normal, no tremors or atrophy is present.  .  Neurological: coordination overall normal.  Deep tendon reflex/nerve stretch intact.  Sensation normal.  Cranial nerves II-XII intact.   Skin:   Normal overall no scars, lesions, ulcers or rashes.  No psoriasis.  Psychiatric: Alert and oriented x 3.  Recent memory intact, remote memory unclear.  Normal mood and affect. Well groomed.  Good eye contact.  Cardiovascular: overall no swelling, no varicosities, no edema bilaterally, normal temperatures of the legs and arms, no clubbing, cyanosis and good capillary refill.  Lymphatic: palpation is normal.  Spine/Pelvis examination:  Inspection:  Overall, sacoiliac joint benign and hips nontender; without crepitus or defects.   Thoracic spine inspection: Alignment normal without kyphosis present   Lumbar spine inspection:  Alignment  with normal lumbar lordosis, without scoliosis apparent.   Thoracic spine palpation:  without tenderness of spinal processes   Lumbar spine palpation: without tenderness of lumbar area; without tightness of lumbar muscles    Range of Motion:   Lumbar flexion, forward flexion is normal without pain or tenderness    Lumbar extension is full without pain or tenderness   Left lateral bend is normal without pain or tenderness   Right lateral bend is normal without pain or tenderness   Straight leg raising is normal  Strength & tone: normal   Stability overall normal stability All other systems reviewed and are negative   The patient has been educated about the nature of the problem(s) and counseled on treatment options.  The patient appeared to understand what I have discussed and is in agreement with it.  Encounter Diagnoses  Name Primary?  . Back pain with left-sided sciatica Yes  . Body mass index 40.0-44.9, adult (HCC)   . Morbid obesity (HCC)     PLAN Call if any problems.  Precautions discussed.  Continue current medications.   Return to clinic 6 weeks   To see Dr. 10-11-1982  Electronically Signed Alvester Morin, MD 6/2/20229:13 AM

## 2021-06-13 ENCOUNTER — Telehealth: Payer: Self-pay | Admitting: Physical Medicine and Rehabilitation

## 2021-06-13 NOTE — Telephone Encounter (Signed)
Patient called needing to cancel her appointment. Patient advised do not wish to reschedule at this time.  The number to contact patient is [phone]

## 2021-06-13 NOTE — Telephone Encounter (Signed)
Appointment cancelled

## 2021-06-17 ENCOUNTER — Ambulatory Visit: Payer: BC Managed Care – PPO | Admitting: Physical Medicine and Rehabilitation

## 2021-07-08 ENCOUNTER — Telehealth: Payer: Self-pay | Admitting: Orthopaedic Surgery

## 2021-07-08 NOTE — Telephone Encounter (Signed)
Patient called to request to cancel the appointment with Dr Hilda Lias for 07/11/21; states would like to hold; said is good to go for now, and will call back when able to reschedule.

## 2021-07-11 ENCOUNTER — Ambulatory Visit: Payer: BC Managed Care – PPO | Admitting: Orthopaedic Surgery

## 2021-11-12 ENCOUNTER — Encounter: Payer: Self-pay | Admitting: Internal Medicine

## 2022-01-24 ENCOUNTER — Other Ambulatory Visit: Payer: Self-pay | Admitting: Emergency Medicine

## 2022-01-24 DIAGNOSIS — Z1231 Encounter for screening mammogram for malignant neoplasm of breast: Secondary | ICD-10-CM

## 2022-03-26 ENCOUNTER — Encounter: Payer: Self-pay | Admitting: Gastroenterology

## 2022-03-26 NOTE — Progress Notes (Deleted)
? ?Referring Provider: Smith Robert, MD ?Primary Care Physician:  Smith Robert, MD ?Primary Gastroenterologist:  Dr. Marletta Lor ? ?No chief complaint on file. ? ? ?HPI:   ?Erin Olson is a 62 y.o. female presenting today at the request of Smith Robert, MD for consult colonoscopy. ? ? ? ?No past medical history on file. ? ?No past surgical history on file. ? ?Current Outpatient Medications  ?Medication Sig Dispense Refill  ? ALPRAZolam (XANAX) 0.5 MG tablet Take 0.5 mg by mouth at bedtime as needed for anxiety.    ? atorvastatin (LIPITOR) 40 MG tablet Take 1 tablet by mouth daily.    ? cyclobenzaprine (FLEXERIL) 10 MG tablet Take 1 tablet (10 mg total) by mouth at bedtime. One tablet every night at bedtime as needed for spasm. 30 tablet 0  ? diclofenac Sodium (VOLTAREN) 1 % GEL Apply topically 4 (four) times daily.    ? fish oil-omega-3 fatty acids 1000 MG capsule 1,200 mg 2 (two) times daily.    ? gabapentin (NEURONTIN) 300 MG capsule Take 300 mg by mouth 3 (three) times daily.    ? HYDROcodone-acetaminophen (NORCO/VICODIN) 5-325 MG tablet One tablet every six hours for pain.  Limit 7 days. (Patient not taking: Reported on 05/09/2021) 28 tablet 0  ? LEVEMIR FLEXTOUCH 100 UNIT/ML FlexPen Inject into the skin.    ? lisinopril (PRINIVIL,ZESTRIL) 2.5 MG tablet Take 2.5 mg by mouth daily. (Patient not taking: Reported on 05/09/2021)    ? Multiple Vitamin (MULTIVITAMIN) tablet Take 1 tablet by mouth daily.    ? naproxen (NAPROSYN) 500 MG tablet Take 1 tablet (500 mg total) by mouth 2 (two) times daily with a meal. 60 tablet 5  ? NOVOLOG FLEXPEN 100 UNIT/ML FlexPen Inject into the skin.    ? sertraline (ZOLOFT) 100 MG tablet Take 100 mg by mouth daily.    ? traMADol (ULTRAM) 50 MG tablet Take by mouth every 6 (six) hours as needed. (Patient not taking: Reported on 05/09/2021)    ? ?No current facility-administered medications for this visit.  ? ? ?Allergies as of 03/27/2022 - Review Complete 05/30/2021  ?Allergen  Reaction Noted  ? Latex  06/17/2017  ? ? ?Family History  ?Problem Relation Age of Onset  ? Breast cancer Neg Hx   ? ? ?Social History  ? ?Socioeconomic History  ? Marital status: Married  ?  Spouse name: Not on file  ? Number of children: Not on file  ? Years of education: Not on file  ? Highest education level: Not on file  ?Occupational History  ? Not on file  ?Tobacco Use  ? Smoking status: Never  ? Smokeless tobacco: Never  ?Vaping Use  ? Vaping Use: Never used  ?Substance and Sexual Activity  ? Alcohol use: No  ? Drug use: No  ? Sexual activity: Yes  ?Other Topics Concern  ? Not on file  ?Social History Narrative  ? Not on file  ? ?Social Determinants of Health  ? ?Financial Resource Strain: Not on file  ?Food Insecurity: Not on file  ?Transportation Needs: Not on file  ?Physical Activity: Not on file  ?Stress: Not on file  ?Social Connections: Not on file  ?Intimate Partner Violence: Not on file  ? ? ?Review of Systems: ?Gen: Denies any fever, chills, cold or flulike symptoms, presyncope, syncope. ?CV: Denies chest pain, heart palpitations. ?Resp: Denies shortness of breath or cough. ?GI:  ?GU : Denies urinary burning, urinary frequency, urinary hesitancy ?MS: Denies joint pain.  ?Derm:  Denies rash ?Psych: Denies depression, anxiety ?Heme: See HPI ? ?Physical Exam: ?There were no vitals taken for this visit. ?General:   Alert and oriented. Pleasant and cooperative. Well-nourished and well-developed.  ?Head:  Normocephalic and atraumatic. ?Eyes:  Without icterus, sclera clear and conjunctiva pink.  ?Ears:  Normal auditory acuity. ?Lungs:  Clear to auscultation bilaterally. No wheezes, rales, or rhonchi. No distress.  ?Heart:  S1, S2 present without murmurs appreciated.  ?Abdomen:  +BS, soft, non-tender and non-distended. No HSM noted. No guarding or rebound. No masses appreciated.  ?Rectal:  Deferred  ?Msk:  Symmetrical without gross deformities. Normal posture. ?Extremities:  Without edema. ?Neurologic:   Alert and  oriented x4;  grossly normal neurologically. ?Skin:  Intact without significant lesions or rashes. ?Psych:  Normal mood and affect. ? ? ? ?Assessment:  ? ? ? ?Plan:  ?*** ? ? ?Ermalinda Memos, PA-C ?[facility] ?03/27/2022 ?  ?

## 2022-03-27 ENCOUNTER — Ambulatory Visit: Payer: BC Managed Care – PPO | Admitting: Gastroenterology

## 2022-05-27 ENCOUNTER — Encounter (HOSPITAL_COMMUNITY): Payer: Self-pay | Admitting: Emergency Medicine

## 2022-05-27 ENCOUNTER — Other Ambulatory Visit: Payer: Self-pay

## 2022-05-27 ENCOUNTER — Emergency Department (HOSPITAL_COMMUNITY): Payer: BC Managed Care – PPO

## 2022-05-27 ENCOUNTER — Emergency Department (HOSPITAL_COMMUNITY)
Admission: EM | Admit: 2022-05-27 | Discharge: 2022-05-27 | Disposition: A | Discharge status: Home / Self Care | Payer: BC Managed Care – PPO | Attending: Emergency Medicine | Admitting: Emergency Medicine

## 2022-05-27 DIAGNOSIS — Y990 Civilian activity done for income or pay: Secondary | ICD-10-CM | POA: Insufficient documentation

## 2022-05-27 DIAGNOSIS — Z794 Long term (current) use of insulin: Secondary | ICD-10-CM | POA: Insufficient documentation

## 2022-05-27 DIAGNOSIS — M25562 Pain in left knee: Secondary | ICD-10-CM | POA: Insufficient documentation

## 2022-05-27 DIAGNOSIS — Z91040 Latex allergy status: Secondary | ICD-10-CM | POA: Insufficient documentation

## 2022-05-27 DIAGNOSIS — X509XXA Other and unspecified overexertion or strenuous movements or postures, initial encounter: Secondary | ICD-10-CM | POA: Insufficient documentation

## 2022-05-27 DIAGNOSIS — Y9301 Activity, walking, marching and hiking: Secondary | ICD-10-CM | POA: Diagnosis not present

## 2022-05-27 LAB — GLUCOSE, CAPILLARY: Glucose-Capillary: 137 mg/dL — ABNORMAL HIGH (ref 70–99)

## 2022-05-27 MED ORDER — HYDROCODONE-ACETAMINOPHEN 5-325 MG PO TABS
1.0000 | ORAL_TABLET | Freq: Once | ORAL | Status: AC
  Administered 2022-05-27: 1 via ORAL
  Filled 2022-05-27: qty 1

## 2022-05-27 MED ORDER — CELECOXIB 200 MG PO CAPS: 200.0000 mg | ORAL_CAPSULE | Freq: Two times a day (BID) | ORAL | 0 refills | Status: DC

## 2022-05-27 MED ORDER — ONDANSETRON 4 MG PO TBDP
4.0000 mg | ORAL_TABLET | Freq: Once | ORAL | Status: AC
  Administered 2022-05-27: 4 mg via ORAL
  Filled 2022-05-27: qty 1

## 2022-05-27 NOTE — ED Triage Notes (Signed)
Pt was walking at home and heard a popped in left knee, currently have knee pain.

## 2022-05-27 NOTE — Discharge Instructions (Signed)
Contact a doctor if:  The knee pain does not stop.  The knee pain changes or gets worse.  You have a fever along with knee pain.  Your knee is red or feels warm when you touch it.  Your knee gives out or locks up.  Get help right away if:  Your knee swells, and the swelling gets worse.  You cannot move your knee.  You have very bad knee pain that does not get better with pain medicine.

## 2022-05-27 NOTE — ED Provider Notes (Signed)
[facility] EMERGENCY DEPARTMENT Provider Note   CSN: [id number] Arrival date & time: 05/27/22  1733     History  Chief Complaint  Patient presents with   Knee Injury    Erin Olson is a 61 y.o. female who presents emergency department chief complaint of left knee pain.  Patient states that earlier she was at work walking when she heard a loud pop in her knee.  She states that she had some pain at that time however she just stood still.  Her friends came with a wheelchair and when she tried to stand up and bear weight she had severe pain in the knee.  She is never had anything like this before.  She came here for further evaluation.  She has no previous injuries to the left knee.  Pain is located on the lateral side  HPI     Home Medications Prior to Admission medications   Medication Sig Start Date End Date Taking? Authorizing Provider  ALPRAZolam Duanne Moron) 0.5 MG tablet Take 0.5 mg by mouth at bedtime as needed for anxiety.    [provider]  atorvastatin (LIPITOR) 40 MG tablet Take 1 tablet by mouth daily. 03/13/21   [provider]  cyclobenzaprine (FLEXERIL) 10 MG tablet Take 1 tablet (10 mg total) by mouth at bedtime. One tablet every night at bedtime as needed for spasm. 05/13/21   Sanjuana Kava, MD  diclofenac Sodium (VOLTAREN) 1 % GEL Apply topically 4 (four) times daily.    [provider]  fish oil-omega-3 fatty acids 1000 MG capsule 1,200 mg 2 (two) times daily.    [provider]  gabapentin (NEURONTIN) 300 MG capsule Take 300 mg by mouth 3 (three) times daily.    [provider]  HYDROcodone-acetaminophen (NORCO/VICODIN) 5-325 MG tablet One tablet every six hours for pain.  Limit 7 days. Patient not taking: Reported on 05/09/2021 04/18/21   Sanjuana Kava, MD  LEVEMIR FLEXTOUCH 100 UNIT/ML FlexPen Inject into the skin. 02/26/21   [provider]  lisinopril (PRINIVIL,ZESTRIL) 2.5 MG tablet Take 2.5 mg by mouth  daily. Patient not taking: Reported on 05/09/2021    [provider]  Multiple Vitamin (MULTIVITAMIN) tablet Take 1 tablet by mouth daily.    [provider]  naproxen (NAPROSYN) 500 MG tablet Take 1 tablet (500 mg total) by mouth 2 (two) times daily with a meal. 04/04/21   Sanjuana Kava, MD  NOVOLOG FLEXPEN 100 UNIT/ML FlexPen Inject into the skin. 10/31/20   [provider]  sertraline (ZOLOFT) 100 MG tablet Take 100 mg by mouth daily.    [provider]  traMADol (ULTRAM) 50 MG tablet Take by mouth every 6 (six) hours as needed. Patient not taking: Reported on 05/09/2021    [provider]      Allergies    Latex    Review of Systems   Review of Systems  Physical Exam Updated Vital Signs BP (!) 164/96 (BP Location: Right Arm)   Pulse 79   Temp 98.5 F (36.9 C) (Oral)   Resp 20   Ht 5\' 1"  (1.549 m)   Wt 101.2 kg   SpO2 100%   BMI 42.14 kg/m  Physical Exam Vitals and nursing note reviewed.  Constitutional:      General: She is not in acute distress.    Appearance: She is well-developed. She is not diaphoretic.  HENT:     Head: Normocephalic and atraumatic.     Right Ear: External ear normal.  Left Ear: External ear normal.     Nose: Nose normal.     Mouth/Throat:     Mouth: Mucous membranes are moist.  Eyes:     General: No scleral icterus.    Conjunctiva/sclera: Conjunctivae normal.  Cardiovascular:     Rate and Rhythm: Normal rate and regular rhythm.     Heart sounds: Normal heart sounds. No murmur heard.   No friction rub. No gallop.  Pulmonary:     Effort: Pulmonary effort is normal. No respiratory distress.     Breath sounds: Normal breath sounds.  Abdominal:     General: Bowel sounds are normal. There is no distension.     Palpations: Abdomen is soft. There is no mass.     Tenderness: There is no abdominal tenderness. There is no guarding.  Musculoskeletal:     Cervical back: Normal range of motion.      Comments: Left knee without any overt tenderness, no joint line tenderness.  Positive McMurray's test, range of motion limited, pain is worse with flexion of the knee, less bad with extension.  She has full strength at the knee joint.  Skin:    General: Skin is warm and dry.  Neurological:     Mental Status: She is alert and oriented to person, place, and time.  Psychiatric:        Behavior: Behavior normal.    ED Results / Procedures / Treatments   Labs (all labs ordered are listed, but only abnormal results are displayed) Labs Reviewed  GLUCOSE, CAPILLARY - Abnormal; Notable for the following components:      Result Value   Glucose-Capillary 137 (*)    All other components within normal limits    EKG None  Radiology DG Knee Complete 4 Views Left  Result Date: 05/27/2022 CLINICAL DATA:  Knee pain after hearing pop while walking. EXAM: LEFT KNEE - COMPLETE 4+ VIEW COMPARISON:  None Available. FINDINGS: No evidence of fracture, dislocation, or joint effusion. No evidence of significant arthropathy or other focal bone abnormality. Soft tissues are unremarkable. IMPRESSION: No acute osseous abnormality. Electronically Signed   By: Dahlia Bailiff M.D.   On: 05/27/2022 18:57    Procedures Procedures    Medications Ordered in ED Medications - No data to display  ED Course/ Medical Decision Making/ A&P                           Medical Decision Making Patient here with knee pain.  I have reviewed her x-ray and interpreted which shows no acute findings.  Patient may have a meniscus tear.  Given knee sleeve.  We will give her some pain medication for tonight and discharged on anti-inflammatories with close outpatient follow-up with orthopedics  Amount and/or Complexity of Data Reviewed Radiology: ordered.    Details: I visualized interpreted 4 view knee x-ray which shows no acute findings           Final Clinical Impression(s) / ED Diagnoses Final diagnoses:  Acute pain  of left knee    Rx / DC Orders ED Discharge Orders     None         Margarita Mail, PA-C [id number]    Lianne Cure, DO [id number]

## 2022-06-03 ENCOUNTER — Ambulatory Visit: Payer: BC Managed Care – PPO | Admitting: Orthopaedic Surgery

## 2022-11-14 ENCOUNTER — Other Ambulatory Visit: Payer: Self-pay | Admitting: Emergency Medicine

## 2022-11-14 DIAGNOSIS — Z1231 Encounter for screening mammogram for malignant neoplasm of breast: Secondary | ICD-10-CM

## 2023-02-26 ENCOUNTER — Encounter: Payer: Self-pay | Admitting: Radiology

## 2023-04-20 ENCOUNTER — Other Ambulatory Visit: Payer: Self-pay | Admitting: Family Medicine

## 2023-04-20 DIAGNOSIS — Z1231 Encounter for screening mammogram for malignant neoplasm of breast: Secondary | ICD-10-CM

## 2023-05-20 ENCOUNTER — Other Ambulatory Visit (HOSPITAL_COMMUNITY): Payer: Self-pay | Admitting: Nurse Practitioner

## 2023-05-20 DIAGNOSIS — M7989 Other specified soft tissue disorders: Secondary | ICD-10-CM

## 2023-05-21 ENCOUNTER — Ambulatory Visit (HOSPITAL_COMMUNITY)
Admission: RE | Admit: 2023-05-21 | Discharge: 2023-05-21 | Disposition: A | Discharge status: Home / Self Care | Payer: BC Managed Care – PPO | Source: Ambulatory Visit | Attending: Nurse Practitioner | Admitting: Nurse Practitioner

## 2023-05-21 DIAGNOSIS — M7989 Other specified soft tissue disorders: Secondary | ICD-10-CM | POA: Insufficient documentation

## 2023-06-24 ENCOUNTER — Other Ambulatory Visit (HOSPITAL_COMMUNITY): Payer: Self-pay | Admitting: Nurse Practitioner

## 2023-06-24 DIAGNOSIS — I872 Venous insufficiency (chronic) (peripheral): Secondary | ICD-10-CM

## 2023-07-08 ENCOUNTER — Encounter (HOSPITAL_COMMUNITY): Payer: Self-pay | Admitting: Nurse Practitioner

## 2023-07-08 ENCOUNTER — Other Ambulatory Visit (HOSPITAL_COMMUNITY): Payer: Self-pay | Admitting: Nurse Practitioner

## 2023-07-08 DIAGNOSIS — I872 Venous insufficiency (chronic) (peripheral): Secondary | ICD-10-CM

## 2023-07-09 ENCOUNTER — Other Ambulatory Visit: Payer: Self-pay | Admitting: Nurse Practitioner

## 2023-07-09 DIAGNOSIS — M7989 Other specified soft tissue disorders: Secondary | ICD-10-CM

## 2023-07-13 ENCOUNTER — Encounter (HOSPITAL_COMMUNITY): Payer: Self-pay

## 2023-07-13 ENCOUNTER — Ambulatory Visit (HOSPITAL_COMMUNITY)
Admission: RE | Admit: 2023-07-13 | Discharge: 2023-07-13 | Disposition: A | Discharge status: Home / Self Care | Payer: BC Managed Care – PPO | Source: Ambulatory Visit | Attending: Nurse Practitioner | Admitting: Nurse Practitioner

## 2023-07-13 DIAGNOSIS — I872 Venous insufficiency (chronic) (peripheral): Secondary | ICD-10-CM

## 2023-07-17 ENCOUNTER — Encounter (HOSPITAL_BASED_OUTPATIENT_CLINIC_OR_DEPARTMENT_OTHER): Payer: Self-pay

## 2023-07-17 ENCOUNTER — Ambulatory Visit (HOSPITAL_BASED_OUTPATIENT_CLINIC_OR_DEPARTMENT_OTHER)
Admission: RE | Admit: 2023-07-17 | Discharge: 2023-07-17 | Disposition: A | Discharge status: Home / Self Care | Payer: BC Managed Care – PPO | Source: Ambulatory Visit | Attending: Nurse Practitioner | Admitting: Nurse Practitioner

## 2023-07-17 DIAGNOSIS — M7989 Other specified soft tissue disorders: Secondary | ICD-10-CM | POA: Diagnosis not present

## 2023-07-17 MED ORDER — GADOBUTROL 1 MMOL/ML IV SOLN
10.0000 mL | Freq: Once | INTRAVENOUS | Status: AC | PRN
  Administered 2023-07-17: 10 mL via INTRAVENOUS
  Filled 2023-07-17: qty 10

## 2023-07-21 ENCOUNTER — Encounter (HOSPITAL_COMMUNITY): Payer: Self-pay | Admitting: Nurse Practitioner

## 2023-07-22 ENCOUNTER — Ambulatory Visit (INDEPENDENT_AMBULATORY_CARE_PROVIDER_SITE_OTHER)
Admission: RE | Admit: 2023-07-22 | Discharge: 2023-07-22 | Disposition: A | Discharge status: Home / Self Care | Payer: BC Managed Care – PPO | Source: Ambulatory Visit | Attending: Vascular Surgery | Admitting: Vascular Surgery

## 2023-07-22 ENCOUNTER — Encounter (HOSPITAL_COMMUNITY): Payer: Self-pay | Admitting: Nurse Practitioner

## 2023-07-22 ENCOUNTER — Other Ambulatory Visit (HOSPITAL_COMMUNITY): Payer: Self-pay | Admitting: Nurse Practitioner

## 2023-07-22 ENCOUNTER — Ambulatory Visit (HOSPITAL_COMMUNITY)
Admission: RE | Admit: 2023-07-22 | Discharge: 2023-07-22 | Disposition: A | Discharge status: Home / Self Care | Payer: BC Managed Care – PPO | Source: Ambulatory Visit | Attending: Vascular Surgery | Admitting: Vascular Surgery

## 2023-07-22 DIAGNOSIS — I872 Venous insufficiency (chronic) (peripheral): Secondary | ICD-10-CM

## 2023-07-22 DIAGNOSIS — I739 Peripheral vascular disease, unspecified: Secondary | ICD-10-CM

## 2023-07-22 LAB — VAS US ABI WITH/WO TBI
Left ABI: 1.01
Right ABI: 1.18

## 2023-10-18 IMAGING — DX DG KNEE COMPLETE 4+V*L*
4 series · 4 of 4 positions shown · non-contrast
Comparison: None Available.

CLINICAL DATA: Knee pain after hearing pop while walking.

EXAM:
LEFT KNEE - COMPLETE 4+ VIEW

[knee ap]
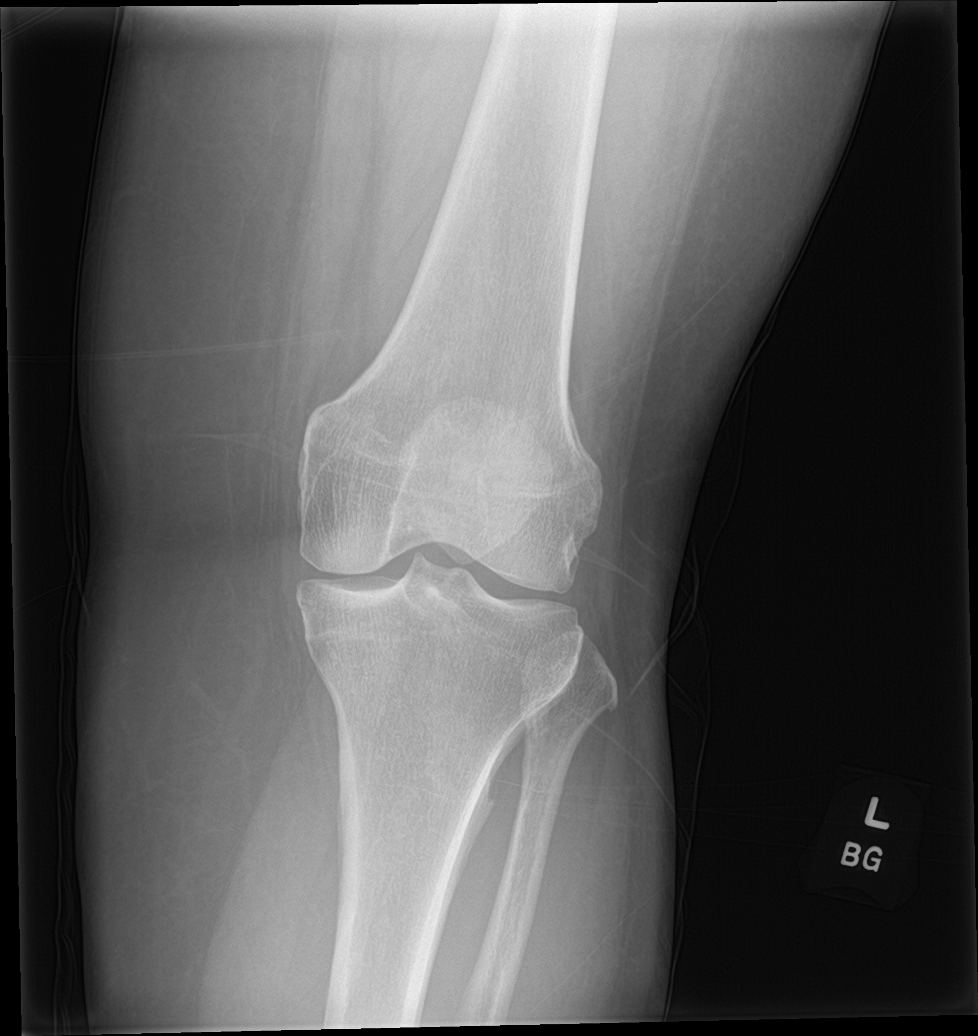

[knee obl (1 of 2)]
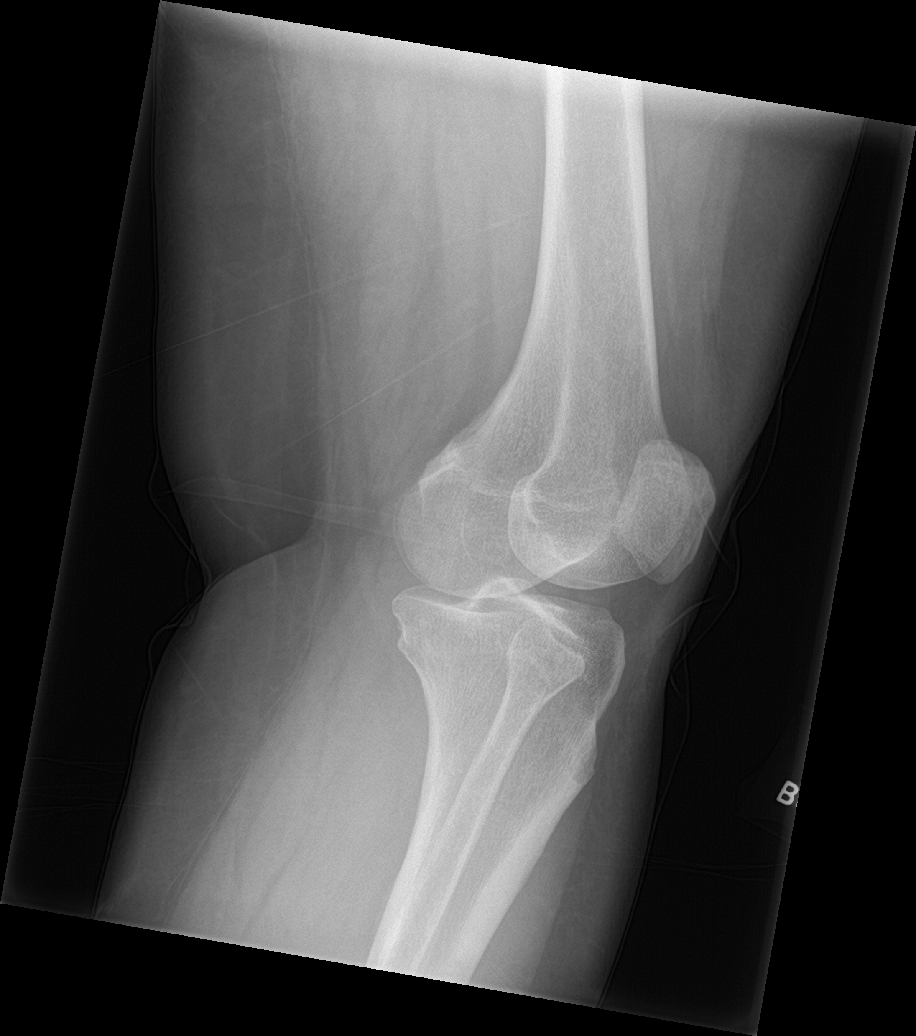

[knee obl (2 of 2)]
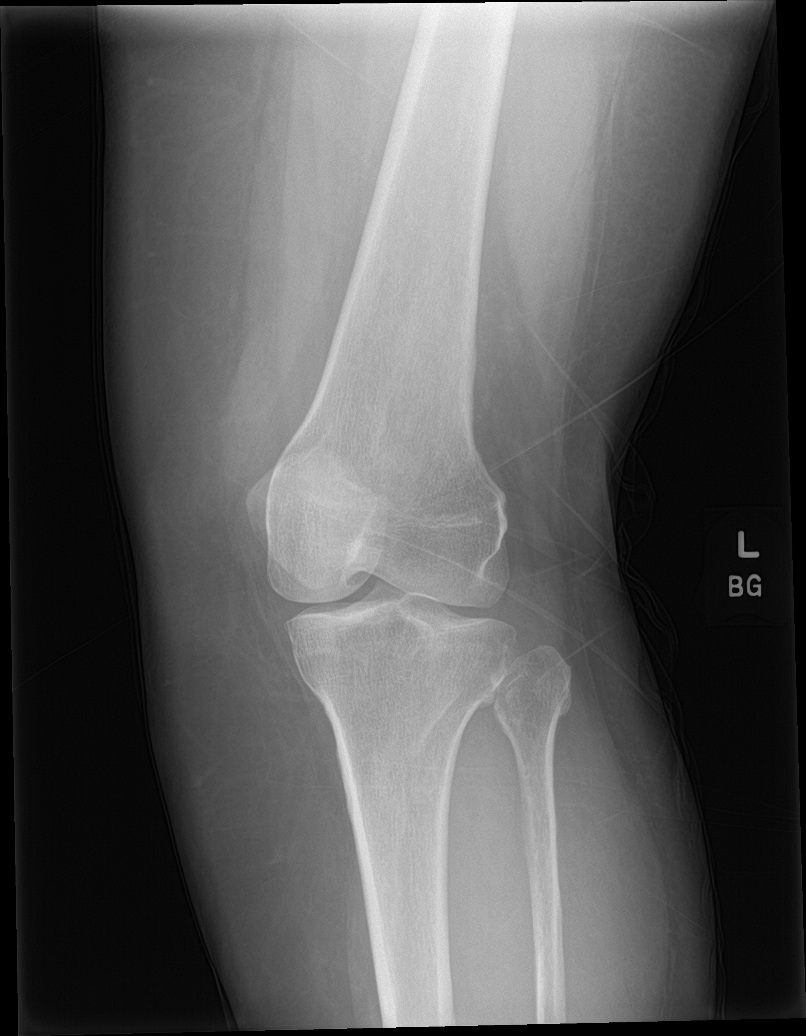

[knee lat]
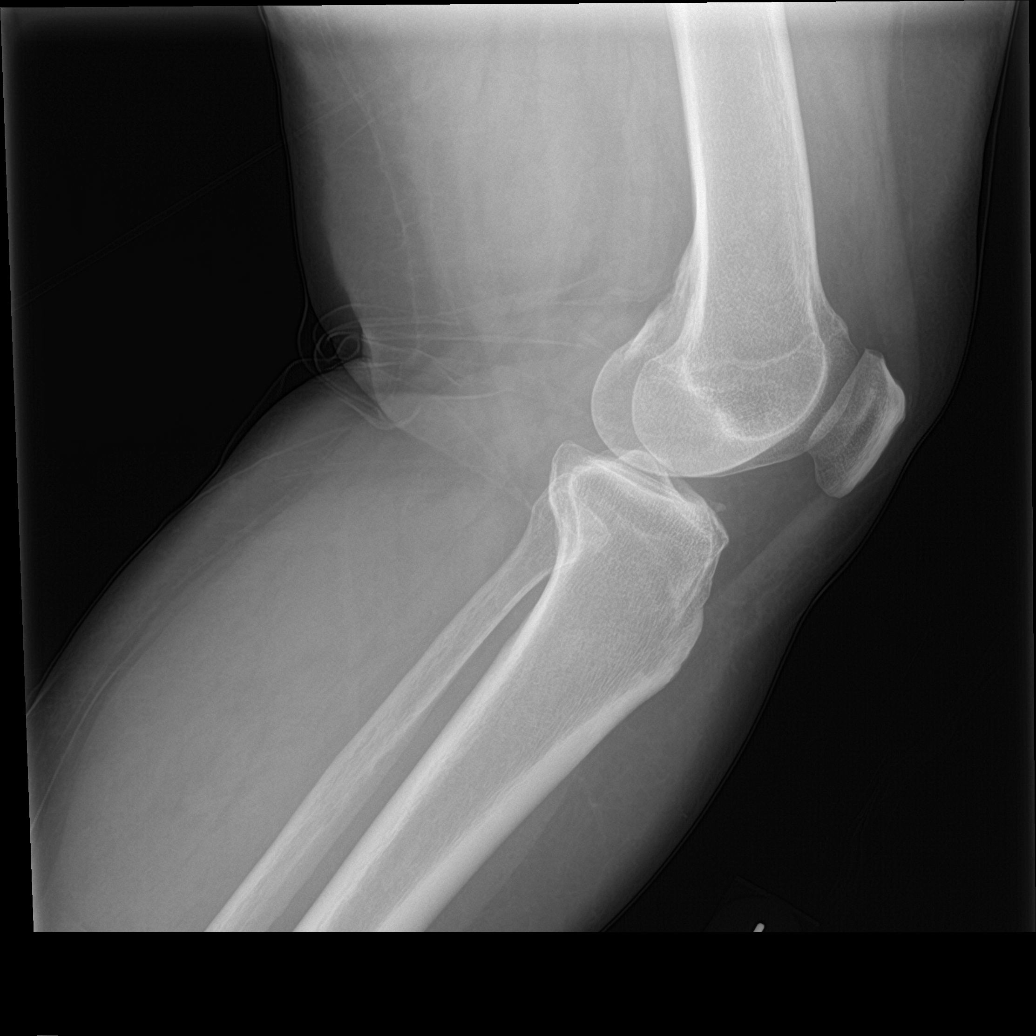

[4 of 4 positions shown; findings below may reference images not displayed]

FINDINGS: No evidence of fracture, dislocation, or joint effusion. No evidence
of significant arthropathy or other focal bone abnormality. Soft
tissues are unremarkable.
IMPRESSION: No acute osseous abnormality.

## 2024-08-19 ENCOUNTER — Encounter: Payer: Self-pay | Admitting: Radiology

## 2024-10-31 ENCOUNTER — Encounter: Payer: Self-pay | Admitting: Radiology

## 2024-11-28 ENCOUNTER — Ambulatory Visit: Admitting: Sleep Medicine

## 2024-11-28 ENCOUNTER — Encounter: Payer: Self-pay | Admitting: Sleep Medicine

## 2024-11-28 VITALS — BP 130/80 | HR 99 | Temp 99.2°F | Ht 61.0 in | Wt 243.2 lb

## 2024-11-28 DIAGNOSIS — G4700 Insomnia, unspecified: Secondary | ICD-10-CM

## 2024-11-28 DIAGNOSIS — Z6841 Body Mass Index (BMI) 40.0 and over, adult: Secondary | ICD-10-CM

## 2024-11-28 DIAGNOSIS — G4733 Obstructive sleep apnea (adult) (pediatric): Secondary | ICD-10-CM

## 2024-11-28 DIAGNOSIS — F5104 Psychophysiologic insomnia: Secondary | ICD-10-CM

## 2024-11-28 NOTE — Progress Notes (Signed)
 Name:Erin Olson MRN: [id number] DOB: 1961-12-05   CHIEF COMPLAINT:  EXCESSIVE DAYTIME SLEEPINESS   HISTORY OF PRESENT ILLNESS: Erin Olson is a 63 y.o. w/ a h/o PCOS, HTN, DMII, anxiety, depression and morbid obesity who presents for c/o loud snoring and excessive daytime sleepiness which has been present for several years. Reports nocturnal awakenings due to nocturia, however does not have difficulty falling back to sleep. Denies any significant weight changes. Admits to dry mouth and morning headaches. Denies RLS symptoms, dream enactment, cataplexy, hypnagogic or hypnapompic hallucinations. Reports a family history of sleep apnea. Denies drowsy driving. Drinks 4 sodas daily, denies alcohol, tobacco or illicit drug use.   Bedtime 11 pm Sleep onset 30-45 mins Rise time 6:45 am   EPWORTH SLEEP SCORE 9    11/28/2024   10:00 AM  Results of the Epworth flowsheet  Sitting and reading 0  Watching TV 2  Sitting, inactive in a public place (e.g. a theatre or a meeting) 0  As a passenger in a car for an hour without a break 3  Lying down to rest in the afternoon when circumstances permit 3  Sitting and talking to someone 0  Sitting quietly after a lunch without alcohol 1  In a car, while stopped for a few minutes in traffic 0  Total score 9    PAST MEDICAL HISTORY :   has a past medical history of GAD (generalized anxiety disorder), HLD (hyperlipidemia), HTN (hypertension), TIA (transient ischemic attack), and Type 2 diabetes mellitus (HCC).  has a past surgical history that includes Dilation and curettage of uterus. Prior to Admission medications   Medication Sig Start Date End Date Taking? Authorizing Provider  ALPRAZolam (XANAX) 0.5 MG tablet Take 0.5 mg by mouth at bedtime as needed for anxiety.   Yes [provider]  atorvastatin (LIPITOR) 40 MG tablet Take 1 tablet by mouth daily. 03/13/21  Yes [provider]  Continuous Glucose Receiver (DEXCOM G7  RECEIVER) DEVI as directed for 365 days   Yes [provider]  Continuous Glucose Sensor (DEXCOM G7 SENSOR) MISC SMARTSIG:Every 10 Days   Yes [provider]  diclofenac Sodium (VOLTAREN) 1 % GEL Apply topically 4 (four) times daily.   Yes [provider]  EMBECTA PEN NEEDLE ULTRAFINE 31G X 8 MM MISC 3 times a day for 90 days   Yes [provider]  fish oil-omega-3 fatty acids 1000 MG capsule 1,200 mg 2 (two) times daily.   Yes [provider]  gabapentin (NEURONTIN) 300 MG capsule Take 300 mg by mouth 3 (three) times daily.   Yes [provider]  LANTUS SOLOSTAR 100 UNIT/ML Solostar Pen Inject into the skin daily.   Yes [provider]  LEVEMIR FLEXTOUCH 100 UNIT/ML FlexPen Inject into the skin. 02/26/21  Yes [provider]  lisinopril (PRINIVIL,ZESTRIL) 2.5 MG tablet Take 2.5 mg by mouth daily.   Yes [provider]  Multiple Vitamin (MULTIVITAMIN) tablet Take 1 tablet by mouth daily.   Yes [provider]  NOVOLOG FLEXPEN 100 UNIT/ML FlexPen Inject into the skin. 10/31/20  Yes [provider]  sertraline (ZOLOFT) 50 MG tablet Take 50 mg by mouth at bedtime.   Yes [provider]   Allergies  Allergen Reactions   Latex     FAMILY HISTORY:  family history is not on file. SOCIAL HISTORY:  reports that she has never smoked. She has never used smokeless tobacco. She reports that she does  not drink alcohol and does not use drugs.   Review of Systems:  Gen:  Denies  fever, sweats, chills weight loss  HEENT: Denies blurred vision, double vision, ear pain, eye pain, hearing loss, nose bleeds, sore throat Cardiac:  No dizziness, chest pain or heaviness, chest tightness,edema, No JVD Resp:   No cough, -sputum production, -shortness of breath,-wheezing, -hemoptysis,  Gi: Denies swallowing difficulty, stomach pain, nausea or vomiting, diarrhea, constipation, bowel incontinence Gu:  Denies  bladder incontinence, burning urine Ext:   Denies Joint pain, stiffness or swelling Skin: Denies  skin rash, easy bruising or bleeding or hives Endoc:  Denies polyuria, polydipsia , polyphagia or weight change Psych:   Denies depression, insomnia or hallucinations  Other:  All other systems negative  VITAL SIGNS: BP 130/80   Pulse 99   Temp 99.2 F (37.3 C)   Ht 5' 1 (1.549 m)   Wt 243 lb 3.2 oz (110.3 kg)   LMP  (LMP Unknown)   SpO2 97%   BMI 45.95 kg/m    Physical Examination:   General Appearance: No distress  EYES PERRLA, EOM intact.   NECK Supple, No JVD Pulmonary: normal breath sounds, No wheezing.  CardiovascularNormal S1,S2.  No m/r/g.   Abdomen: Benign, Soft, non-tender. Skin:   warm, no rashes, no ecchymosis  Extremities: normal, no cyanosis, clubbing. Neuro:without focal findings,  speech normal  PSYCHIATRIC: Mood, affect within normal limits.   ASSESSMENT AND PLAN  OSA I suspect that OSA is likely present due to clinical presentation. Discussed the consequences of untreated sleep apnea. Advised not to drive drowsy for safety of patient and others. Will complete further evaluation with a home sleep study and follow up to review results.    Morbid obesity Counseled patient on diet and lifestyle modification.   Insomnia Counseled patient on stimulus control and improving sleep hygiene practices.    MEDICATION ADJUSTMENTS/LABS AND TESTS ORDERED: Recommend Sleep Study   Patient  satisfied with Plan of action and management. All questions answered  Follow up to review HST results and treatment plan.   I spent a total of 32 minutes reviewing chart data, face-to-face evaluation with the patient, counseling and coordination of care as detailed above.    Alashia Brownfield, M.D.  Sleep Medicine [facility]

## 2024-11-28 NOTE — Patient Instructions (Signed)
 Erin Olson

## 2024-12-13 ENCOUNTER — Encounter

## 2024-12-13 DIAGNOSIS — G4733 Obstructive sleep apnea (adult) (pediatric): Secondary | ICD-10-CM

## 2024-12-26 DIAGNOSIS — R069 Unspecified abnormalities of breathing: Secondary | ICD-10-CM | POA: Diagnosis not present

## 2024-12-28 ENCOUNTER — Ambulatory Visit: Payer: Self-pay

## 2024-12-28 DIAGNOSIS — F5104 Psychophysiologic insomnia: Secondary | ICD-10-CM

## 2024-12-28 DIAGNOSIS — G4733 Obstructive sleep apnea (adult) (pediatric): Secondary | ICD-10-CM
